# Patient Record
Sex: Male | Born: 1970
Health system: Southern US, Community
[De-identification: ages and names within clinical notes are randomized; demographics above are authoritative.]

## PROBLEM LIST (undated history)

## (undated) DIAGNOSIS — I10 Essential (primary) hypertension: Secondary | ICD-10-CM

## (undated) DIAGNOSIS — E7800 Pure hypercholesterolemia, unspecified: Secondary | ICD-10-CM

---

## 2019-04-02 DIAGNOSIS — K219 Gastro-esophageal reflux disease without esophagitis: Secondary | ICD-10-CM | POA: Diagnosis not present

## 2019-04-02 DIAGNOSIS — D696 Thrombocytopenia, unspecified: Secondary | ICD-10-CM | POA: Diagnosis not present

## 2019-04-02 DIAGNOSIS — Z23 Encounter for immunization: Secondary | ICD-10-CM | POA: Diagnosis not present

## 2019-04-02 DIAGNOSIS — E7800 Pure hypercholesterolemia, unspecified: Secondary | ICD-10-CM | POA: Diagnosis not present

## 2019-04-02 DIAGNOSIS — I10 Essential (primary) hypertension: Secondary | ICD-10-CM | POA: Diagnosis not present

## 2019-10-07 DIAGNOSIS — E7800 Pure hypercholesterolemia, unspecified: Secondary | ICD-10-CM | POA: Diagnosis not present

## 2019-10-07 DIAGNOSIS — Z0000 Encounter for general adult medical examination without abnormal findings: Secondary | ICD-10-CM | POA: Diagnosis not present

## 2019-10-07 DIAGNOSIS — I10 Essential (primary) hypertension: Secondary | ICD-10-CM | POA: Diagnosis not present

## 2020-01-11 DIAGNOSIS — J300 Vasomotor rhinitis: Secondary | ICD-10-CM | POA: Diagnosis not present

## 2020-05-22 ENCOUNTER — Emergency Department
Admission: EM | Admit: 2020-05-22 | Discharge: 2020-05-22 | Disposition: A | Discharge status: Home / Self Care | Payer: Self-pay | Source: Home / Self Care | Attending: Family Medicine | Admitting: Family Medicine

## 2020-05-22 ENCOUNTER — Emergency Department (INDEPENDENT_AMBULATORY_CARE_PROVIDER_SITE_OTHER): Payer: BC Managed Care – PPO

## 2020-05-22 ENCOUNTER — Other Ambulatory Visit: Payer: Self-pay

## 2020-05-22 DIAGNOSIS — M47816 Spondylosis without myelopathy or radiculopathy, lumbar region: Secondary | ICD-10-CM | POA: Diagnosis not present

## 2020-05-22 DIAGNOSIS — M545 Low back pain, unspecified: Secondary | ICD-10-CM | POA: Diagnosis not present

## 2020-05-22 DIAGNOSIS — M5136 Other intervertebral disc degeneration, lumbar region: Secondary | ICD-10-CM | POA: Diagnosis not present

## 2020-05-22 DIAGNOSIS — S39012A Strain of muscle, fascia and tendon of lower back, initial encounter: Secondary | ICD-10-CM

## 2020-05-22 HISTORY — DX: Essential (primary) hypertension: I10

## 2020-05-22 HISTORY — DX: Pure hypercholesterolemia, unspecified: E78.00

## 2020-05-22 MED ORDER — PREDNISONE 20 MG PO TABS: ORAL_TABLET | ORAL | 0 refills | Status: AC

## 2020-05-22 MED ORDER — CYCLOBENZAPRINE HCL 10 MG PO TABS: 10.0000 mg | ORAL_TABLET | Freq: Two times a day (BID) | ORAL | 0 refills | Status: AC | PRN

## 2020-05-22 NOTE — ED Provider Notes (Signed)
[facility]    CSN: [id number] Arrival date & time: 05/22/20  0165      History   Chief Complaint Chief Complaint  Patient presents with  . Back Pain    HPI Jeffrey Casey is a 49 y.o. male.   While lifting four days ago, patient felt a pulling sensation in his right lower back, now radiating intermittently to his left hip area.   He denies bowel or bladder dysfunction, and no saddle numbness.   The history is provided by the patient.  Back Pain Location:  Lumbar spine Quality:  Aching Radiates to: right hip. Pain severity:  Moderate Pain is:  Same all the time Onset quality:  Sudden Duration:  4 days Timing:  Constant Progression:  Unchanged Chronicity:  New Context: lifting heavy objects   Relieved by:  Nothing Worsened by:  Bending and movement Ineffective treatments:  Heating pad Associated symptoms: no abdominal pain, no abdominal swelling, no bladder incontinence, no bowel incontinence, no dysuria, no fever, no leg pain, no numbness, no paresthesias, no pelvic pain, no perianal numbness, no tingling, no weakness and no weight loss   Risk factors: obesity     Past Medical History:  Diagnosis Date  . High cholesterol   . Hypertension     There are no problems to display for this patient.   History reviewed. No pertinent surgical history.     Home Medications    Prior to Admission medications   Medication Sig Start Date End Date Taking? Authorizing Provider  amLODipine (NORVASC) 10 MG tablet Take 10 mg by mouth daily. 04/04/20  Yes [provider]  atorvastatin (LIPITOR) 20 MG tablet Take 20 mg by mouth daily. 04/04/20  Yes [provider]  benazepril (LOTENSIN) 20 MG tablet Take 20 mg by mouth daily. 04/04/20  Yes [provider]  cyclobenzaprine (FLEXERIL) 10 MG tablet Take 1 tablet (10 mg total) by mouth 2 (two) times daily as needed for up to 10 days for muscle spasms. 05/22/20 06/01/20  Lattie Haw, MD    predniSONE (DELTASONE) 20 MG tablet Take one tab by mouth twice daily for 4 days, then one daily for 3 days. Take with food. 05/22/20   Lattie Haw, MD    Family History Family History  Problem Relation Age of Onset  . Cancer Mother     Social History Social History   Tobacco Use  . Smoking status: Never Smoker  . Smokeless tobacco: Never Used  Substance Use Topics  . Alcohol use: Yes    Comment: occasionally  . Drug use: Never     Allergies   Patient has no known allergies.   Review of Systems Review of Systems  Constitutional: Positive for activity change. Negative for appetite change, chills, diaphoresis, fatigue, fever and weight loss.  Gastrointestinal: Negative for abdominal pain and bowel incontinence.  Genitourinary: Negative for bladder incontinence, dysuria, frequency, hematuria, pelvic pain and urgency.  Musculoskeletal: Positive for back pain.  Skin: Negative for rash.  Neurological: Negative for tingling, weakness, numbness and paresthesias.  All other systems reviewed and are negative.    Physical Exam Triage Vital Signs ED Triage Vitals  Enc Vitals Group     BP 05/22/20 0952 (!) 144/94     Pulse Rate 05/22/20 0952 80     Resp --      Temp 05/22/20 0952 98.8 F (37.1 C)     Temp src --      SpO2 05/22/20 0952 98 %  Weight 05/22/20 0949 223 lb (101.2 kg)     Height 05/22/20 0949 5\' 11"  (1.803 m)     Head Circumference --      Peak Flow --      Pain Score 05/22/20 0949 6     Pain Loc --      Pain Edu? --      Excl. in GC? --    No data found.  Updated Vital Signs BP (!) 144/94 (BP Location: Right Arm)   Pulse 80   Temp 98.8 F (37.1 C)   Ht 5\' 11"  (1.803 m)   Wt 101.2 kg   SpO2 98%   BMI 31.10 kg/m   Visual Acuity Right Eye Distance:   Left Eye Distance:   Bilateral Distance:    Right Eye Near:   Left Eye Near:    Bilateral Near:     Physical Exam Vitals and nursing note reviewed.  Constitutional:      General:  He is not in acute distress.    Appearance: He is obese.  HENT:     Head: Normocephalic.     Mouth/Throat:     Pharynx: Oropharynx is clear.  Eyes:     Conjunctiva/sclera: Conjunctivae normal.     Pupils: Pupils are equal, round, and reactive to light.  Cardiovascular:     Rate and Rhythm: Normal rate and regular rhythm.     Heart sounds: Normal heart sounds.  Pulmonary:     Breath sounds: Normal breath sounds.  Abdominal:     Palpations: Abdomen is soft.     Tenderness: There is no abdominal tenderness.  Musculoskeletal:     Cervical back: Normal range of motion.       Back:     Right lower leg: No edema.     Left lower leg: No edema.     Comments:  Back:  Range of motion relatively well preserved.  Can heel/toe walk and squat without difficulty. Tenderness in the right paraspinous muscles from L4 to Sacral area.  Straight leg raising test is negative.  Sitting knee extension test is negative.  Strength and sensation in the lower extremities is normal.  Patellar and achilles reflexes are normal   Skin:    General: Skin is warm and dry.     Findings: No rash.  Neurological:     General: No focal deficit present.     Mental Status: He is alert.      UC Treatments / Results  Labs (all labs ordered are listed, but only abnormal results are displayed) Labs Reviewed - No data to display  EKG   Radiology DG Lumbar Spine Complete  Result Date: 05/22/2020 CLINICAL DATA:  Right lower back pain 4 days radiating to right hip. No injury. EXAM: LUMBAR SPINE - COMPLETE 4+ VIEW COMPARISON:  None. FINDINGS: Vertebral body alignment and heights are normal. There is mild spondylosis throughout the lumbar spine to include facet arthropathy. There is no compression fracture or spondylolisthesis/spondylolysis. Mild disc space narrowing at the L4-5 and L5-S1 levels. No acute fracture. IMPRESSION: 1. No acute findings. 2. Mild spondylosis of the lumbar spine with disc disease at the L4-5 and  L5-S1 levels. Electronically Signed   By: M.D.   On: 05/22/2020 11:37    Procedures Procedures (including critical care time)  Medications Ordered in UC Medications - No data to display  Initial Impression / Assessment and Plan / UC Course  I have reviewed the triage vital signs and  the nursing notes.  Pertinent labs & imaging results that were available during my care of the patient were reviewed by me and considered in my medical decision making (see chart for details).    Note spondylosis throughout on LS spine x-rays, as well as facet arthropathy. Suspect mild radiculopathy. Begin prednisone burst/taper and Flexeril. Followup with Dr. [facility] ([facility]) if not improving about two weeks.    Final Clinical Impressions(s) / UC Diagnoses   Final diagnoses:  Strain of lumbar region, initial encounter     Discharge Instructions     Apply ice pack for 20 to 30 minutes, 3 to 4 times daily  Continue until pain and swelling decrease.  Begin range of motion and stretching exercises as tolerated.    ED Prescriptions    Medication Sig Dispense Auth. Provider   predniSONE (DELTASONE) 20 MG tablet Take one tab by mouth twice daily for 4 days, then one daily for 3 days. Take with food. 11 tablet Lattie Haw, MD   cyclobenzaprine (FLEXERIL) 10 MG tablet Take 1 tablet (10 mg total) by mouth 2 (two) times daily as needed for up to 10 days for muscle spasms. 20 tablet Lattie Haw, MD        Lattie Haw, MD 06/01/20 [phone]

## 2020-05-22 NOTE — ED Triage Notes (Signed)
x2 days. Pt states that he thinks that he pulled a muscle in his back. Pt states that the pain is starting to radiate down the right side of his leg.

## 2020-05-22 NOTE — Discharge Instructions (Addendum)
Apply ice pack for 20 to 30 minutes, 3 to 4 times daily  Continue until pain and swelling decrease.  Begin range of motion and stretching exercises as tolerated. 

## 2020-06-01 DIAGNOSIS — I10 Essential (primary) hypertension: Secondary | ICD-10-CM | POA: Diagnosis not present

## 2020-06-01 DIAGNOSIS — S335XXS Sprain of ligaments of lumbar spine, sequela: Secondary | ICD-10-CM | POA: Diagnosis not present

## 2020-06-03 DIAGNOSIS — Z20822 Contact with and (suspected) exposure to covid-19: Secondary | ICD-10-CM | POA: Diagnosis not present

## 2020-08-05 DIAGNOSIS — I10 Essential (primary) hypertension: Secondary | ICD-10-CM | POA: Diagnosis not present

## 2020-08-05 DIAGNOSIS — J300 Vasomotor rhinitis: Secondary | ICD-10-CM | POA: Diagnosis not present

## 2020-10-13 DIAGNOSIS — E785 Hyperlipidemia, unspecified: Secondary | ICD-10-CM | POA: Diagnosis not present

## 2020-10-13 DIAGNOSIS — Z0000 Encounter for general adult medical examination without abnormal findings: Secondary | ICD-10-CM | POA: Diagnosis not present

## 2021-04-14 DIAGNOSIS — I10 Essential (primary) hypertension: Secondary | ICD-10-CM | POA: Diagnosis not present

## 2021-04-14 DIAGNOSIS — E785 Hyperlipidemia, unspecified: Secondary | ICD-10-CM | POA: Diagnosis not present

## 2021-07-05 DIAGNOSIS — D123 Benign neoplasm of transverse colon: Secondary | ICD-10-CM | POA: Diagnosis not present

## 2021-07-05 DIAGNOSIS — Z1211 Encounter for screening for malignant neoplasm of colon: Secondary | ICD-10-CM | POA: Diagnosis not present

## 2021-10-20 IMAGING — DX DG LUMBAR SPINE COMPLETE 4+V
5 series · 5 of 5 positions shown · non-contrast
Comparison: None.

CLINICAL DATA: Right lower back pain 4 days radiating to right hip.
No injury.

EXAM:
LUMBAR SPINE - COMPLETE 4+ VIEW

[l-spine ap]
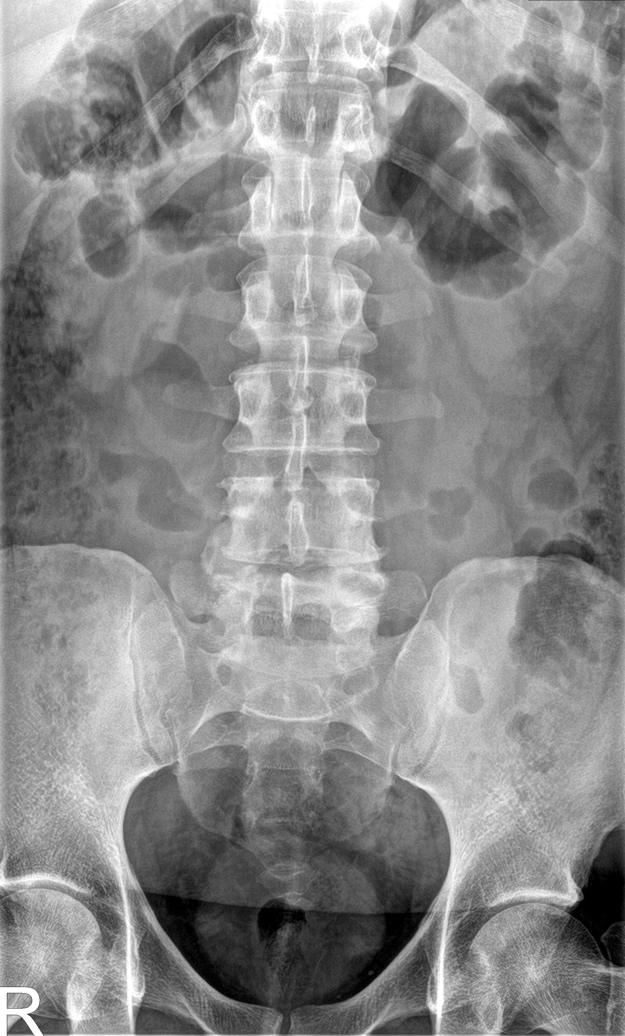

[l-spine obl (1 of 2)]
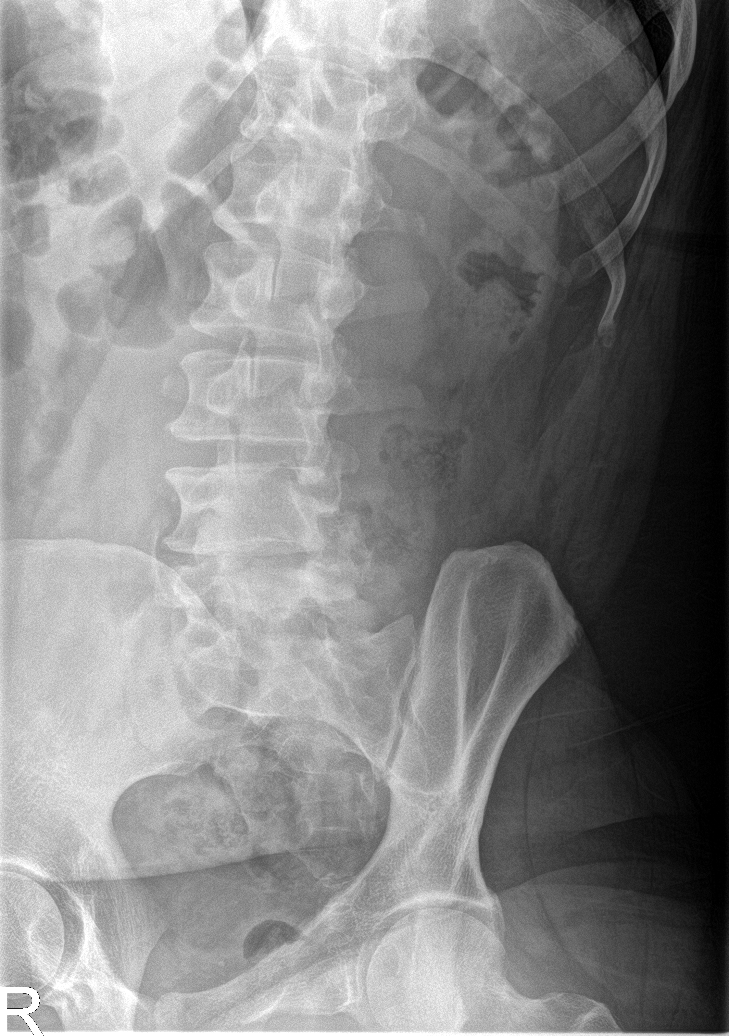

[l-spine obl (2 of 2)]
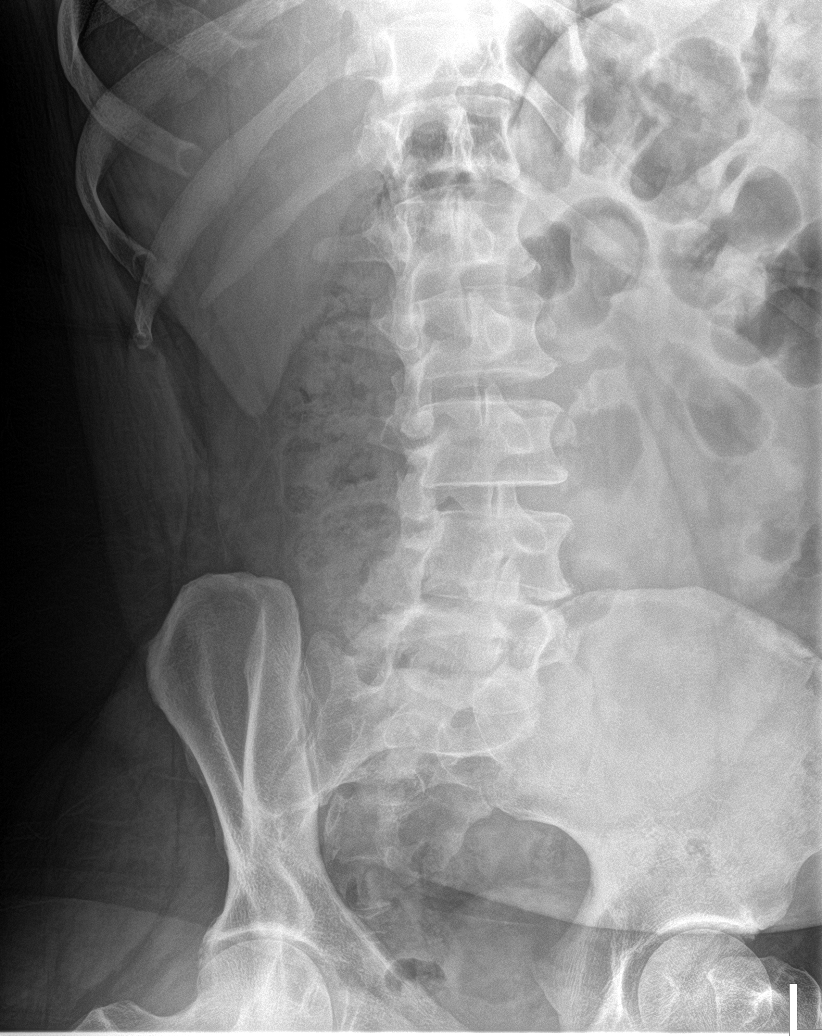

[l-spine lat]
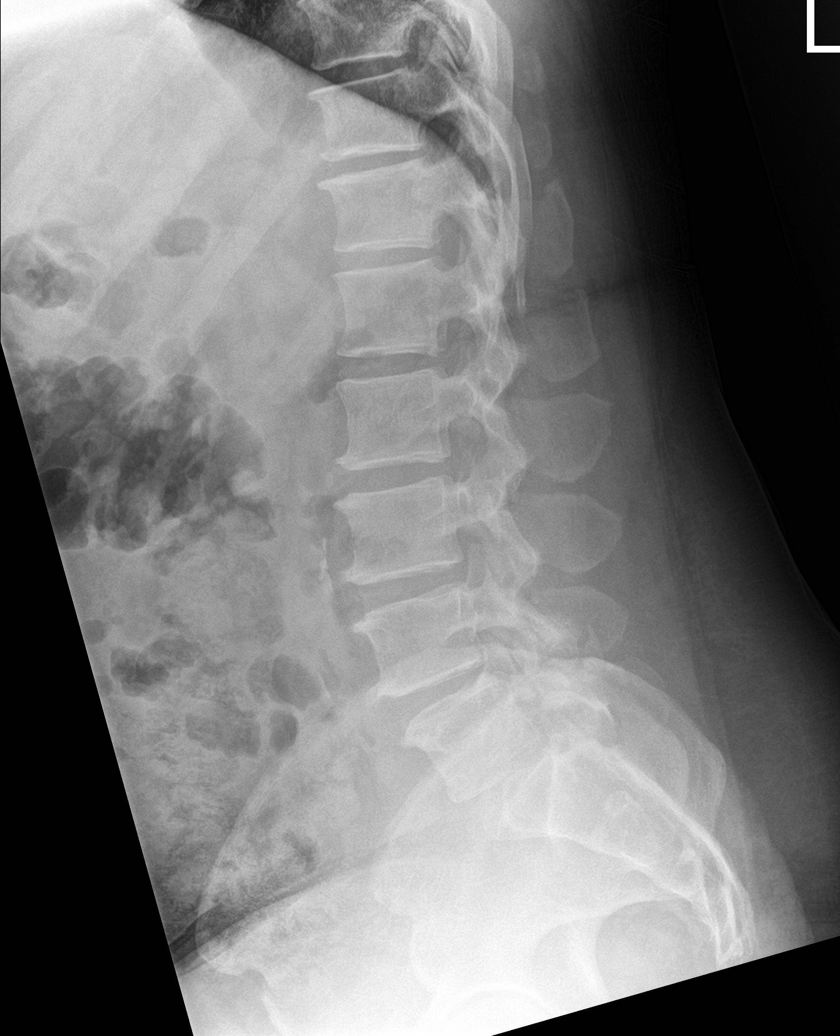

[l-spine spot]
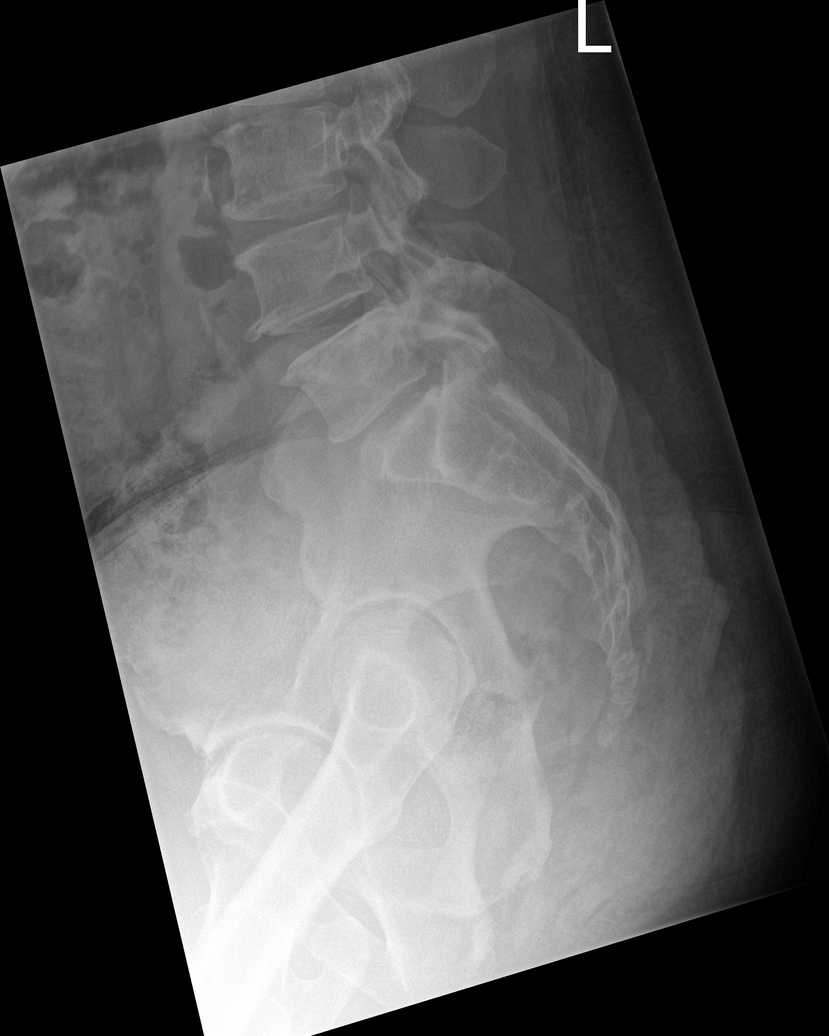

[5 of 5 positions shown; findings below may reference images not displayed]

FINDINGS: Vertebral body alignment and heights are normal. There is mild
spondylosis throughout the lumbar spine to include facet
arthropathy. There is no compression fracture or
spondylolisthesis/spondylolysis. Mild disc space narrowing at the
L4-5 and L5-S1 levels. No acute fracture.
IMPRESSION: 1. No acute findings.
2. Mild spondylosis of the lumbar spine with disc disease at the
L4-5 and L5-S1 levels.

## 2021-10-25 DIAGNOSIS — Z0000 Encounter for general adult medical examination without abnormal findings: Secondary | ICD-10-CM | POA: Diagnosis not present

## 2021-10-25 DIAGNOSIS — K219 Gastro-esophageal reflux disease without esophagitis: Secondary | ICD-10-CM | POA: Diagnosis not present

## 2021-10-25 DIAGNOSIS — E785 Hyperlipidemia, unspecified: Secondary | ICD-10-CM | POA: Diagnosis not present

## 2021-10-25 DIAGNOSIS — I10 Essential (primary) hypertension: Secondary | ICD-10-CM | POA: Diagnosis not present

## 2021-10-25 DIAGNOSIS — D696 Thrombocytopenia, unspecified: Secondary | ICD-10-CM | POA: Diagnosis not present

## 2022-04-26 DIAGNOSIS — I10 Essential (primary) hypertension: Secondary | ICD-10-CM | POA: Diagnosis not present

## 2022-04-26 DIAGNOSIS — E785 Hyperlipidemia, unspecified: Secondary | ICD-10-CM | POA: Diagnosis not present

## 2022-04-26 DIAGNOSIS — J309 Allergic rhinitis, unspecified: Secondary | ICD-10-CM | POA: Diagnosis not present

## 2022-11-12 DIAGNOSIS — E785 Hyperlipidemia, unspecified: Secondary | ICD-10-CM | POA: Diagnosis not present

## 2022-11-12 DIAGNOSIS — M25562 Pain in left knee: Secondary | ICD-10-CM | POA: Diagnosis not present

## 2022-11-12 DIAGNOSIS — Z0000 Encounter for general adult medical examination without abnormal findings: Secondary | ICD-10-CM | POA: Diagnosis not present

## 2022-11-12 DIAGNOSIS — I10 Essential (primary) hypertension: Secondary | ICD-10-CM | POA: Diagnosis not present

## 2022-11-12 DIAGNOSIS — Z125 Encounter for screening for malignant neoplasm of prostate: Secondary | ICD-10-CM | POA: Diagnosis not present

## 2023-01-15 DIAGNOSIS — E8889 Other specified metabolic disorders: Secondary | ICD-10-CM | POA: Diagnosis not present

## 2023-01-15 DIAGNOSIS — I10 Essential (primary) hypertension: Secondary | ICD-10-CM | POA: Diagnosis not present

## 2023-01-15 DIAGNOSIS — Z6836 Body mass index (BMI) 36.0-36.9, adult: Secondary | ICD-10-CM | POA: Diagnosis not present

## 2023-01-15 DIAGNOSIS — R5383 Other fatigue: Secondary | ICD-10-CM | POA: Diagnosis not present

## 2023-01-15 DIAGNOSIS — E785 Hyperlipidemia, unspecified: Secondary | ICD-10-CM | POA: Diagnosis not present

## 2023-01-15 DIAGNOSIS — R7309 Other abnormal glucose: Secondary | ICD-10-CM | POA: Diagnosis not present

## 2023-01-15 DIAGNOSIS — Z1389 Encounter for screening for other disorder: Secondary | ICD-10-CM | POA: Diagnosis not present

## 2023-01-29 DIAGNOSIS — E559 Vitamin D deficiency, unspecified: Secondary | ICD-10-CM | POA: Diagnosis not present

## 2023-01-29 DIAGNOSIS — R7303 Prediabetes: Secondary | ICD-10-CM | POA: Diagnosis not present

## 2023-01-29 DIAGNOSIS — I10 Essential (primary) hypertension: Secondary | ICD-10-CM | POA: Diagnosis not present

## 2023-02-05 DIAGNOSIS — E7849 Other hyperlipidemia: Secondary | ICD-10-CM | POA: Diagnosis not present

## 2023-02-05 DIAGNOSIS — G4719 Other hypersomnia: Secondary | ICD-10-CM | POA: Diagnosis not present

## 2023-02-05 DIAGNOSIS — I10 Essential (primary) hypertension: Secondary | ICD-10-CM | POA: Diagnosis not present

## 2023-02-05 DIAGNOSIS — E668 Other obesity: Secondary | ICD-10-CM | POA: Diagnosis not present

## 2023-03-07 DIAGNOSIS — I10 Essential (primary) hypertension: Secondary | ICD-10-CM | POA: Diagnosis not present

## 2023-03-07 DIAGNOSIS — E7849 Other hyperlipidemia: Secondary | ICD-10-CM | POA: Diagnosis not present

## 2023-03-07 DIAGNOSIS — G4733 Obstructive sleep apnea (adult) (pediatric): Secondary | ICD-10-CM | POA: Diagnosis not present

## 2023-03-26 DIAGNOSIS — G4733 Obstructive sleep apnea (adult) (pediatric): Secondary | ICD-10-CM | POA: Diagnosis not present

## 2023-03-28 DIAGNOSIS — F320 Major depressive disorder, single episode, mild: Secondary | ICD-10-CM | POA: Diagnosis not present

## 2023-04-25 DIAGNOSIS — G4733 Obstructive sleep apnea (adult) (pediatric): Secondary | ICD-10-CM | POA: Diagnosis not present

## 2023-05-13 DIAGNOSIS — G4733 Obstructive sleep apnea (adult) (pediatric): Secondary | ICD-10-CM | POA: Diagnosis not present

## 2023-05-13 DIAGNOSIS — I10 Essential (primary) hypertension: Secondary | ICD-10-CM | POA: Diagnosis not present

## 2023-05-13 DIAGNOSIS — Z23 Encounter for immunization: Secondary | ICD-10-CM | POA: Diagnosis not present

## 2023-05-13 DIAGNOSIS — R7303 Prediabetes: Secondary | ICD-10-CM | POA: Diagnosis not present

## 2023-05-26 DIAGNOSIS — G4733 Obstructive sleep apnea (adult) (pediatric): Secondary | ICD-10-CM | POA: Diagnosis not present

## 2023-05-27 DIAGNOSIS — G4733 Obstructive sleep apnea (adult) (pediatric): Secondary | ICD-10-CM | POA: Diagnosis not present

## 2023-05-27 DIAGNOSIS — I10 Essential (primary) hypertension: Secondary | ICD-10-CM | POA: Diagnosis not present

## 2023-06-13 DIAGNOSIS — G4733 Obstructive sleep apnea (adult) (pediatric): Secondary | ICD-10-CM | POA: Diagnosis not present

## 2023-06-25 DIAGNOSIS — G4733 Obstructive sleep apnea (adult) (pediatric): Secondary | ICD-10-CM | POA: Diagnosis not present
